# Patient Record
Sex: Male | Born: 1985 | Race: White | Hispanic: No | Marital: Single | Smoking: Never smoker
Health system: Southern US, Community
[De-identification: ages and names within clinical notes are randomized; demographics above are authoritative.]

---

## 2017-09-26 ENCOUNTER — Encounter: Payer: Self-pay | Admitting: Family Medicine

## 2017-09-26 ENCOUNTER — Ambulatory Visit (INDEPENDENT_AMBULATORY_CARE_PROVIDER_SITE_OTHER): Payer: BLUE CROSS/BLUE SHIELD | Admitting: Family Medicine

## 2017-09-26 VITALS — BP 126/80 | HR 77 | Temp 98.4°F | Ht 69.0 in | Wt 196.4 lb

## 2017-09-26 DIAGNOSIS — L989 Disorder of the skin and subcutaneous tissue, unspecified: Secondary | ICD-10-CM | POA: Diagnosis not present

## 2017-09-26 DIAGNOSIS — L409 Psoriasis, unspecified: Secondary | ICD-10-CM | POA: Insufficient documentation

## 2017-09-26 DIAGNOSIS — Z0001 Encounter for general adult medical examination with abnormal findings: Secondary | ICD-10-CM | POA: Diagnosis not present

## 2017-09-26 DIAGNOSIS — L0501 Pilonidal cyst with abscess: Secondary | ICD-10-CM | POA: Diagnosis not present

## 2017-09-26 LAB — LIPID PANEL
CHOL/HDL RATIO: 3
Cholesterol: 117 mg/dL (ref 0–200)
HDL: 37.3 mg/dL — AB (ref >39.00)
LDL CALC: 66 mg/dL (ref 0–99)
NONHDL: 80.13
Triglycerides: 69 mg/dL (ref 0.0–149.0)
VLDL: 13.8 mg/dL (ref 0.0–40.0)

## 2017-09-26 LAB — URINALYSIS, ROUTINE W REFLEX MICROSCOPIC
Bilirubin Urine: NEGATIVE
Hgb urine dipstick: NEGATIVE
Ketones, ur: NEGATIVE
Leukocytes, UA: NEGATIVE
Nitrite: NEGATIVE
PH: 6.5 (ref 5.0–8.0)
RBC / HPF: NONE SEEN (ref 0–?)
SPECIFIC GRAVITY, URINE: 1.02 (ref 1.000–1.030)
TOTAL PROTEIN, URINE-UPE24: NEGATIVE
Urine Glucose: NEGATIVE
Urobilinogen, UA: 0.2 (ref 0.0–1.0)

## 2017-09-26 LAB — COMPREHENSIVE METABOLIC PANEL
ALT: 34 U/L (ref 0–53)
AST: 19 U/L (ref 0–37)
Albumin: 4.8 g/dL (ref 3.5–5.2)
Alkaline Phosphatase: 56 U/L (ref 39–117)
BILIRUBIN TOTAL: 0.6 mg/dL (ref 0.2–1.2)
BUN: 15 mg/dL (ref 6–23)
CHLORIDE: 104 meq/L (ref 96–112)
CO2: 30 meq/L (ref 19–32)
Calcium: 9.9 mg/dL (ref 8.4–10.5)
Creatinine, Ser: 0.92 mg/dL (ref 0.40–1.50)
GFR: 100.07 mL/min (ref >60.00)
GLUCOSE: 98 mg/dL (ref 70–99)
POTASSIUM: 4.1 meq/L (ref 3.5–5.1)
Sodium: 140 mEq/L (ref 135–145)
Total Protein: 7.9 g/dL (ref 6.0–8.3)

## 2017-09-26 LAB — CBC
HCT: 45.4 % (ref 39.0–52.0)
Hemoglobin: 15.4 g/dL (ref 13.0–17.0)
MCHC: 33.9 g/dL (ref 30.0–36.0)
MCV: 87 fl (ref 78.0–100.0)
Platelets: 323 10*3/uL (ref 150.0–400.0)
RBC: 5.21 Mil/uL (ref 4.22–5.81)
RDW: 13.1 % (ref 11.5–15.5)
WBC: 6.8 10*3/uL (ref 4.0–10.5)

## 2017-09-26 MED ORDER — AMOXICILLIN-POT CLAVULANATE 875-125 MG PO TABS: 1.0000 | ORAL_TABLET | Freq: Two times a day (BID) | ORAL | 0 refills | Status: DC

## 2017-09-26 NOTE — Patient Instructions (Signed)
Pilonidal Cyst A pilonidal cyst is a fluid-filled sac. It forms beneath the skin near your tailbone, at the top of the crease of your buttocks. A pilonidal cyst that is not large or infected may not cause symptoms or problems. If the cyst becomes irritated or infected, it may fill with pus. This causes pain and swelling (pilonidal abscess). An infected cyst may need to be treated with medicine, drained, or removed. What are the causes? The cause of a pilonidal cyst is not known. One cause may be a hair that grows into your skin (ingrown hair). What increases the risk? Pilonidal cysts are more common in boys and men. Risk factors include:  Having lots of hair near the crease of the buttocks.  Being overweight.  Having a pilonidal dimple.  Wearing tight clothing.  Not bathing or showering frequently.  Sitting for long periods of time.  What are the signs or symptoms? Signs and symptoms of a pilonidal cyst may include:  Redness.  Pain and tenderness.  Warmth.  Swelling.  Pus.  Fever.  How is this diagnosed? Your health care provider may diagnose a pilonidal cyst based on your symptoms and a physical exam. The health care provider may do a blood test to check for infection. If your cyst is draining pus, your health care provider may take a sample of the drainage to be tested at a laboratory. How is this treated? Surgery is the usual treatment for an infected pilonidal cyst. You may also have to take medicines before surgery. The type of surgery you have depends on the size and severity of the infected cyst. The different kinds of surgery include:  Incision and drainage. This is a procedure to open and drain the cyst.  Marsupialization. In this procedure, a large cyst or abscess may be opened and kept open by stitching the edges of the skin to the cyst walls.  Cyst removal. This procedure involves opening the skin and removing all or part of the cyst.  Follow these  instructions at home:  Follow all of your surgeon's instructions carefully if you had surgery.  Take medicines only as directed by your health care provider.  If you were prescribed an antibiotic medicine, finish it all even if you start to feel better.  Keep the area around your pilonidal cyst clean and dry.  Clean the area as directed by your health care provider. Pat the area dry with a clean towel. Do not rub it as this may cause bleeding.  Remove hair from the area around the cyst as directed by your health care provider.  Do not wear tight clothing or sit in one place for long periods of time.  There are many different ways to close and cover an incision, including stitches, skin glue, and adhesive strips. Follow your health care provider's instructions on: ? Incision care. ? Bandage (dressing) changes and removal. ? Incision closure removal. Contact a health care provider if:  You have drainage, redness, swelling, or pain at the site of the cyst.  You have a fever. This information is not intended to replace advice given to you by your health care provider. Make sure you discuss any questions you have with your health care provider. Document Released: 03/24/2000 Document Revised: 09/02/2015 Document Reviewed: 08/14/2013 Elsevier Interactive Patient Education  2018 [employer]  

## 2017-09-26 NOTE — Progress Notes (Signed)
Subjective:  Patient ID: Adam Hodge, male    DOB: [birth year]  Age: 32 y.o. MRN: [id number]  CC: Establish Care (possible skin cancer? Located on upper left arm.)   HPI Adam Hodge presents for evaluation of the skin lesion on his left upper arm that is been present for a couple of months now.  It has been growing slowly.  It started out as button sized to its current size.  A week or so ago he got up in the middle of the night to go to the bathroom and tripped over his cat.  He hit the doorway and the lesion was traumatically excised.  Patient describes the lesion is being raised uniform in color and purplish.  He does not believe it was centrally umbilicated.  He has been left with a somewhat painful circular lesion.  Significant past medical history of excision of multiple BCCs.  He is status post Mohs surgery on his right cheek approximately 6 years ago.  He has a past medical history of psoriasis.  He has a tender place in his superior gluteal cleft that he has noticed over the last few.  His mother is age 59 and has recently been diagnosed with diabetes.  His father's health history is unknown.  History Adam Hodge has no past medical history on file.   He has no past surgical history on file.   His family history is not on file.He reports that he has never smoked. He has never used smokeless tobacco. His alcohol and drug histories are not on file.  No outpatient medications prior to visit.   No facility-administered medications prior to visit.     ROS Review of Systems  Constitutional: Negative.   HENT: Negative.   Eyes: Negative.   Respiratory: Negative.   Cardiovascular: Negative.   Gastrointestinal: Negative.   Endocrine: Negative for polyphagia and polyuria.  Genitourinary: Negative.   Musculoskeletal: Negative for arthralgias and myalgias.  Skin: Positive for color change and wound.  Allergic/Immunologic: Negative for immunocompromised state.  Neurological: Negative.     Hematological: Negative.   Psychiatric/Behavioral: Negative.     Objective:  BP 126/80   Pulse 77   Temp 98.4 F (36.9 C)   Ht 5\' 9"  (1.753 m)   Wt 196 lb 6 oz (89.1 kg)   SpO2 97%   BMI 29.00 kg/m   Physical Exam  Constitutional: He is oriented to person, place, and time. He appears well-developed and well-nourished. No distress.  HENT:  Head: Normocephalic and atraumatic.  Right Ear: External ear normal.  Left Ear: External ear normal.  Eyes: Right eye exhibits no discharge. Left eye exhibits no discharge.  Neck: No JVD present. No tracheal deviation present.  Pulmonary/Chest: Effort normal.  Neurological: He is alert and oriented to person, place, and time.  Skin: Skin is warm and dry. He is not diaphoretic.     Psychiatric: He has a normal mood and affect. His behavior is normal. Thought content normal.      Assessment & Plan:   Adam Hodge was seen today for establish care.  Diagnoses and all orders for this visit:  Encounter for health maintenance examination with abnormal findings -     CBC -     Comprehensive metabolic panel -     Lipid panel -     Urinalysis, Routine w reflex microscopic  Pilonidal cyst with abscess -     amoxicillin-clavulanate (AUGMENTIN) 875-125 MG tablet; Take 1 tablet by mouth 2 (two) times daily.  Skin lesion of left upper extremity -     Ambulatory referral to Dermatology  Psoriasis   I am having Adam Hodge start on amoxicillin-clavulanate.  Meds ordered this encounter  Medications  . amoxicillin-clavulanate (AUGMENTIN) 875-125 MG tablet    Sig: Take 1 tablet by mouth 2 (two) times daily.    Dispense:  20 tablet    Refill:  0     Follow-up: Return in about 1 month (around 10/26/2017), or return for a complete physical exam..  Mliss SaxWilliam Alfred Kremer, MD

## 2017-10-08 ENCOUNTER — Ambulatory Visit (INDEPENDENT_AMBULATORY_CARE_PROVIDER_SITE_OTHER): Payer: BLUE CROSS/BLUE SHIELD | Admitting: Family Medicine

## 2017-10-08 ENCOUNTER — Encounter: Payer: Self-pay | Admitting: Family Medicine

## 2017-10-08 VITALS — BP 134/80 | HR 91 | Temp 98.3°F | Ht 69.0 in | Wt 199.2 lb

## 2017-10-08 DIAGNOSIS — L989 Disorder of the skin and subcutaneous tissue, unspecified: Secondary | ICD-10-CM | POA: Diagnosis not present

## 2017-10-08 DIAGNOSIS — M79602 Pain in left arm: Secondary | ICD-10-CM

## 2017-10-08 DIAGNOSIS — S46209A Unspecified injury of muscle, fascia and tendon of other parts of biceps, unspecified arm, initial encounter: Secondary | ICD-10-CM | POA: Diagnosis not present

## 2017-10-08 DIAGNOSIS — L0501 Pilonidal cyst with abscess: Secondary | ICD-10-CM

## 2017-10-08 DIAGNOSIS — M25532 Pain in left wrist: Secondary | ICD-10-CM | POA: Insufficient documentation

## 2017-10-08 LAB — D-DIMER, QUANTITATIVE (NOT AT ARMC): D DIMER QUANT: 0.28 ug{FEU}/mL (ref <0.50)

## 2017-10-08 NOTE — Progress Notes (Signed)
Subjective:  Patient ID: Adam Hodge, male    DOB: April 14, 1985  Age: 32 y.o. MRN: [id number]  CC: Follow-up (left arm, completed antibiotic, feels like arm is improving.)   HPI Adam Hodge presents for a lesion on his left upper arm and his pilonidal cyst.  Lesion on the upper arm has scabbed over and is doing better.  Pilonidal cyst seems to be healing.  There is less drainage.  It is less tender.  He tells that he experienced left arm pain in the medial upper arm after carrying a heavy bookcase of the steps into his apartment.  It seems to be improving but initially there was pain with range of motion to the arm.  There is no family history or personal history of DVT.  He is right-hand dominant.  He had suffered a FOOSH injury a year and a half ago involving this arm that did not require medical care.  Outpatient Medications Prior to Visit  Medication Sig Dispense Refill  . amoxicillin-clavulanate (AUGMENTIN) 875-125 MG tablet Take 1 tablet by mouth 2 (two) times daily. 20 tablet 0   No facility-administered medications prior to visit.     ROS Review of Systems  Constitutional: Negative.   HENT: Negative.   Respiratory: Negative for chest tightness, shortness of breath and wheezing.   Cardiovascular: Negative for chest pain.  Musculoskeletal: Positive for myalgias.  Skin: Positive for wound.    Objective:  BP 134/80   Pulse 91   Temp 98.3 F (36.8 C)   Ht 5\' 9"  (1.753 m)   Wt 199 lb 4 oz (90.4 kg)   SpO2 98%   BMI 29.42 kg/m   BP Readings from Last 3 Encounters:  10/08/17 134/80  09/26/17 126/80    Wt Readings from Last 3 Encounters:  10/08/17 199 lb 4 oz (90.4 kg)  09/26/17 196 lb 6 oz (89.1 kg)    Physical Exam  Constitutional: He is oriented to person, place, and time. He appears well-developed and well-nourished. No distress.  Cardiovascular: Intact distal pulses.  Musculoskeletal: He exhibits tenderness and deformity. He exhibits no edema.        Arms: Neurological: He is alert and oriented to person, place, and time.  Skin: He is not diaphoretic.    Lab Results  Component Value Date   WBC 6.8 09/26/2017   HGB 15.4 09/26/2017   HCT 45.4 09/26/2017   PLT 323.0 09/26/2017   GLUCOSE 98 09/26/2017   CHOL 117 09/26/2017   TRIG 69.0 09/26/2017   HDL 37.30 (L) 09/26/2017   LDLCALC 66 09/26/2017   ALT 34 09/26/2017   AST 19 09/26/2017   NA 140 09/26/2017   K 4.1 09/26/2017   CL 104 09/26/2017   CREATININE 0.92 09/26/2017   BUN 15 09/26/2017   CO2 30 09/26/2017    Patient was never admitted.  Assessment & Plan:   Adam Hodge was seen today for follow-up.  Diagnoses and all orders for this visit:  Pilonidal cyst with abscess  Skin lesion of left upper extremity  Arm pain, anterior, left -     D-Dimer, Quantitative -     Ambulatory referral to Sports Medicine  Injury of biceps brachii muscle -     Ambulatory referral to Sports Medicine   I have discontinued Adam Hodge's amoxicillin-clavulanate.  No orders of the defined types were placed in this encounter.  Lesion on left upper arm is scabbed over and healing.  Still believe that it warrants dermatology referral for consideration  of biopsy.  Pilonidal cyst is healing but remains intact.  He will continue sits baths.  May consider surgical referral as needed.  Question biceps/brachialis muscle injury.  Sports medicine referral for consideration of ultrasound.  Remotely concerned about DVT and we are drawing a d-dimer today.  Patient low risk for DVT, especially in his arm.  Follow-up: Return in about 2 weeks (around 10/22/2017), or if symptoms worsen or fail to improve.  Mliss SaxWilliam Alfred Reygan Heagle, MD

## 2017-10-16 ENCOUNTER — Encounter: Payer: Self-pay | Admitting: Family Medicine

## 2017-10-16 ENCOUNTER — Ambulatory Visit (INDEPENDENT_AMBULATORY_CARE_PROVIDER_SITE_OTHER): Payer: BLUE CROSS/BLUE SHIELD

## 2017-10-16 ENCOUNTER — Ambulatory Visit (INDEPENDENT_AMBULATORY_CARE_PROVIDER_SITE_OTHER): Payer: BLUE CROSS/BLUE SHIELD | Admitting: Family Medicine

## 2017-10-16 VITALS — BP 136/78 | HR 86 | Ht 69.0 in | Wt 199.0 lb

## 2017-10-16 DIAGNOSIS — M25532 Pain in left wrist: Secondary | ICD-10-CM | POA: Diagnosis not present

## 2017-10-16 DIAGNOSIS — M79602 Pain in left arm: Secondary | ICD-10-CM

## 2017-10-16 LAB — URIC ACID: Uric Acid, Serum: 5.6 mg/dL (ref 4.0–7.8)

## 2017-10-16 LAB — C-REACTIVE PROTEIN: CRP: 0.4 mg/dL — ABNORMAL LOW (ref 0.5–20.0)

## 2017-10-16 LAB — SEDIMENTATION RATE: Sed Rate: 21 mm/hr — ABNORMAL HIGH (ref 0–15)

## 2017-10-16 MED ORDER — PREDNISONE 5 MG PO TABS: ORAL_TABLET | ORAL | 0 refills | Status: AC

## 2017-10-16 NOTE — Assessment & Plan Note (Signed)
Pain has been ongoing since his fall back onto his hand from a year and a half ago.  Having range of motion issues and appears to have effusion on ultrasound.  Possible for complex regional pain syndrome versus nonhealing fracture.  Unlikely to be autoimmune due to mechanism of trauma. -X-ray today -CRP, sed rate, ANA, RF, anti-CCP -Prednisone -will call with results.

## 2017-10-16 NOTE — Patient Instructions (Signed)
Good to see you  Please try the medication  I will call with the results from today

## 2017-10-16 NOTE — Progress Notes (Signed)
Adam Hodge - 32 y.o. male MRN [id number]  Date of birth: January 13, 1986  SUBJECTIVE:  Including CC & ROS.  Chief Complaint  Patient presents with  . Left arm pain    Adam Hodge is a 32 y.o. male that is here today for left arm pain and wrist pain. He fell one year ago in his house he reached back with his left arm to brace his fall. He has been experiencing pain in the dorsal and volar aspect of his wrist as well as pain that radiates over the anterior aspect of his arm proximally to his axilla.. Admits to swelling. Pain is triggered with extension. He has wore wrist brace in the past. He works in [employer]. He has been taking Aleve for the pain.  He has a history of psoriasis.  He has weakness if uses his left hand repetitively or using it for an extended period of time longer than an hour.  He denies any numbness or tingling.  Review of his blood work from 7/1 shows a normal d-dimer.   Review of Systems  Constitutional: Negative for fever.  HENT: Negative for congestion.   Respiratory: Negative for cough.   Cardiovascular: Negative for chest pain.  Gastrointestinal: Negative for abdominal pain.  Musculoskeletal: Positive for joint swelling.  Skin: Negative for color change.  Neurological: Positive for weakness.  Hematological: Negative for adenopathy.  Psychiatric/Behavioral: Negative for agitation.    HISTORY: Past Medical, Surgical, Social, and Family History Reviewed & Updated per EMR.   Pertinent Historical Findings include:  No past medical history on file.  No past surgical history on file.  No Known Allergies  Family History  Problem Relation Age of Onset  . Diabetes Mother   . Hyperlipidemia Paternal Grandmother   . Hypertension Paternal Grandmother   . Heart attack Paternal Grandfather      Social History   Socioeconomic History  . Marital status: Single    Spouse name: Not on file  . Number of children: Not on file  . Years of education: Not on file    . Highest education level: Not on file  Occupational History  . Not on file  Social Needs  . Financial resource strain: Not on file  . Food insecurity:    Worry: Not on file    Inability: Not on file  . Transportation needs:    Medical: Not on file    Non-medical: Not on file  Tobacco Use  . Smoking status: Never Smoker  . Smokeless tobacco: Never Used  Substance and Sexual Activity  . Alcohol use: Yes  . Drug use: Never  . Sexual activity: Yes    Partners: Female  Lifestyle  . Physical activity:    Days per week: Not on file    Minutes per session: Not on file  . Stress: Not on file  Relationships  . Social connections:    Talks on phone: Not on file    Gets together: Not on file    Attends religious service: Not on file    Active member of club or organization: Not on file    Attends meetings of clubs or organizations: Not on file    Relationship status: Not on file  . Intimate partner violence:    Fear of current or ex partner: Not on file    Emotionally abused: Not on file    Physically abused: Not on file    Forced sexual activity: Not on file  Other Topics Concern  .  Not on file  Social History Narrative  . Not on file     PHYSICAL EXAM:  VS: BP 136/78 (BP Location: Right Arm, Patient Position: Sitting, Cuff Size: Normal)   Pulse 86   Ht 5\' 9"  (1.753 m)   Wt 199 lb (90.3 kg)   BMI 29.39 kg/m  Physical Exam Gen: NAD, alert, cooperative with exam, well-appearing ENT: normal lips, normal nasal mucosa,  Eye: normal EOM, normal conjunctiva and lids CV:  no edema, +2 pedal pulses   Resp: no accessory muscle use, non-labored,  Skin: no rashes, no areas of induration  Neuro: normal tone, normal sensation to touch Psych:  normal insight, alert and oriented MSK:  Left wrist:  The wrist appears to be swollen with no ecchymosis. Limited flexion and extension actively and passively.  Pain with passive movement flexion and extension when trying to take to full  range of motion Normal finger abduction and abduction No tenderness over the snuffbox. Tenderness to palpation over the distal ulna. Normal thumb opposition. Normal pincer grasp. No signs of atrophy. Neurovascularly intact  Limited ultrasound: Left wrist:  Normal-appearing dorsal compartment tendons. Normal-appearing distal radius and ulna. There appears to be an effusion within the carpal bones of the scaphoid junction. Scaphoid has a some heterogeneity on ultrasound to suggest inflammatory change   Summary: Effusion in the wrist to suggest either an autoimmune, nonhealing scaphoid fracture or complex regional pain syndrome.  Ultrasound and interpretation by Clare GandyJeremy Jasir Rother, MD      ASSESSMENT & PLAN:   Left wrist pain Pain has been ongoing since his fall back onto his hand from a year and a half ago.  Having range of motion issues and appears to have effusion on ultrasound.  Possible for complex regional pain syndrome versus nonhealing fracture.  Unlikely to be autoimmune due to mechanism of trauma. -X-ray today -CRP, sed rate, ANA, RF, anti-CCP -Prednisone -will call with results.

## 2017-10-17 ENCOUNTER — Telehealth: Payer: Self-pay | Admitting: Family Medicine

## 2017-10-17 NOTE — Telephone Encounter (Signed)
Spoke with patient about his results. Has improvement with prednisone. Will wait if wants to pursue an MRI or not.   Myra RudeSchmitz, Afshin Chrystal E, MD [facility] 10/17/2017, 2:22 PM

## 2017-10-18 LAB — RHEUMATOID FACTOR: Rhuematoid fact SerPl-aCnc: <14 IU/mL (ref <14)

## 2017-10-18 LAB — CYCLIC CITRUL PEPTIDE ANTIBODY, IGG: Cyclic Citrullin Peptide Ab: <16 UNITS

## 2017-10-18 LAB — ANA: Anti Nuclear Antibody(ANA): NEGATIVE

## 2017-11-26 DIAGNOSIS — D485 Neoplasm of uncertain behavior of skin: Secondary | ICD-10-CM | POA: Diagnosis not present

## 2017-11-26 DIAGNOSIS — D2262 Melanocytic nevi of left upper limb, including shoulder: Secondary | ICD-10-CM | POA: Diagnosis not present

## 2017-11-26 DIAGNOSIS — S61239A Puncture wound without foreign body of unspecified finger without damage to nail, initial encounter: Secondary | ICD-10-CM | POA: Diagnosis not present

## 2017-11-26 DIAGNOSIS — D487 Neoplasm of uncertain behavior of other specified sites: Secondary | ICD-10-CM | POA: Diagnosis not present

## 2017-11-26 DIAGNOSIS — L0889 Other specified local infections of the skin and subcutaneous tissue: Secondary | ICD-10-CM | POA: Diagnosis not present

## 2017-11-26 DIAGNOSIS — A499 Bacterial infection, unspecified: Secondary | ICD-10-CM | POA: Diagnosis not present

## 2017-11-26 DIAGNOSIS — L409 Psoriasis, unspecified: Secondary | ICD-10-CM | POA: Diagnosis not present

## 2017-11-26 DIAGNOSIS — Z7721 Contact with and (suspected) exposure to potentially hazardous body fluids: Secondary | ICD-10-CM | POA: Diagnosis not present

## 2017-12-13 DIAGNOSIS — D485 Neoplasm of uncertain behavior of skin: Secondary | ICD-10-CM | POA: Diagnosis not present

## 2017-12-13 DIAGNOSIS — L905 Scar conditions and fibrosis of skin: Secondary | ICD-10-CM | POA: Diagnosis not present

## 2018-07-10 DIAGNOSIS — A09 Infectious gastroenteritis and colitis, unspecified: Secondary | ICD-10-CM | POA: Diagnosis not present

## 2018-08-22 DIAGNOSIS — L089 Local infection of the skin and subcutaneous tissue, unspecified: Secondary | ICD-10-CM | POA: Diagnosis not present

## 2018-08-22 DIAGNOSIS — D485 Neoplasm of uncertain behavior of skin: Secondary | ICD-10-CM | POA: Diagnosis not present

## 2018-08-22 DIAGNOSIS — R609 Edema, unspecified: Secondary | ICD-10-CM | POA: Diagnosis not present

## 2018-08-22 DIAGNOSIS — L739 Follicular disorder, unspecified: Secondary | ICD-10-CM | POA: Diagnosis not present

## 2018-08-22 DIAGNOSIS — L409 Psoriasis, unspecified: Secondary | ICD-10-CM | POA: Diagnosis not present

## 2018-08-22 DIAGNOSIS — A499 Bacterial infection, unspecified: Secondary | ICD-10-CM | POA: Diagnosis not present

## 2018-08-22 DIAGNOSIS — L720 Epidermal cyst: Secondary | ICD-10-CM | POA: Diagnosis not present

## 2018-08-22 DIAGNOSIS — L308 Other specified dermatitis: Secondary | ICD-10-CM | POA: Diagnosis not present

## 2018-09-05 DIAGNOSIS — L905 Scar conditions and fibrosis of skin: Secondary | ICD-10-CM | POA: Diagnosis not present

## 2019-01-17 DIAGNOSIS — Z85828 Personal history of other malignant neoplasm of skin: Secondary | ICD-10-CM | POA: Diagnosis not present

## 2019-01-17 DIAGNOSIS — L570 Actinic keratosis: Secondary | ICD-10-CM | POA: Diagnosis not present

## 2019-01-17 DIAGNOSIS — L739 Follicular disorder, unspecified: Secondary | ICD-10-CM | POA: Diagnosis not present

## 2019-01-17 DIAGNOSIS — L814 Other melanin hyperpigmentation: Secondary | ICD-10-CM | POA: Diagnosis not present

## 2019-11-11 IMAGING — DX DG WRIST COMPLETE 3+V*L*
4 series · 4 of 4 positions shown · non-contrast
Comparison: None.

CLINICAL DATA: Fell 1 year ago, with pain in the wrist since that
time

EXAM:
LEFT WRIST - COMPLETE 3+ VIEW

[wrist pa]
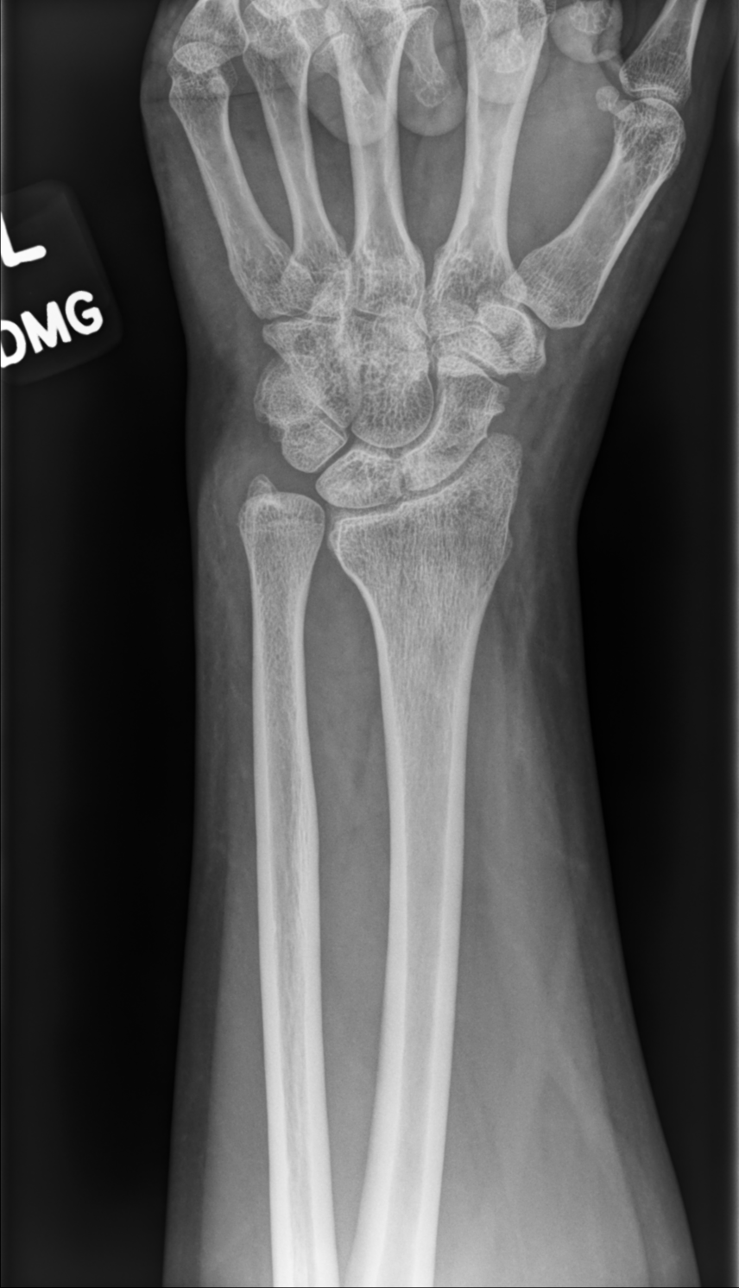

[wrist mlo]
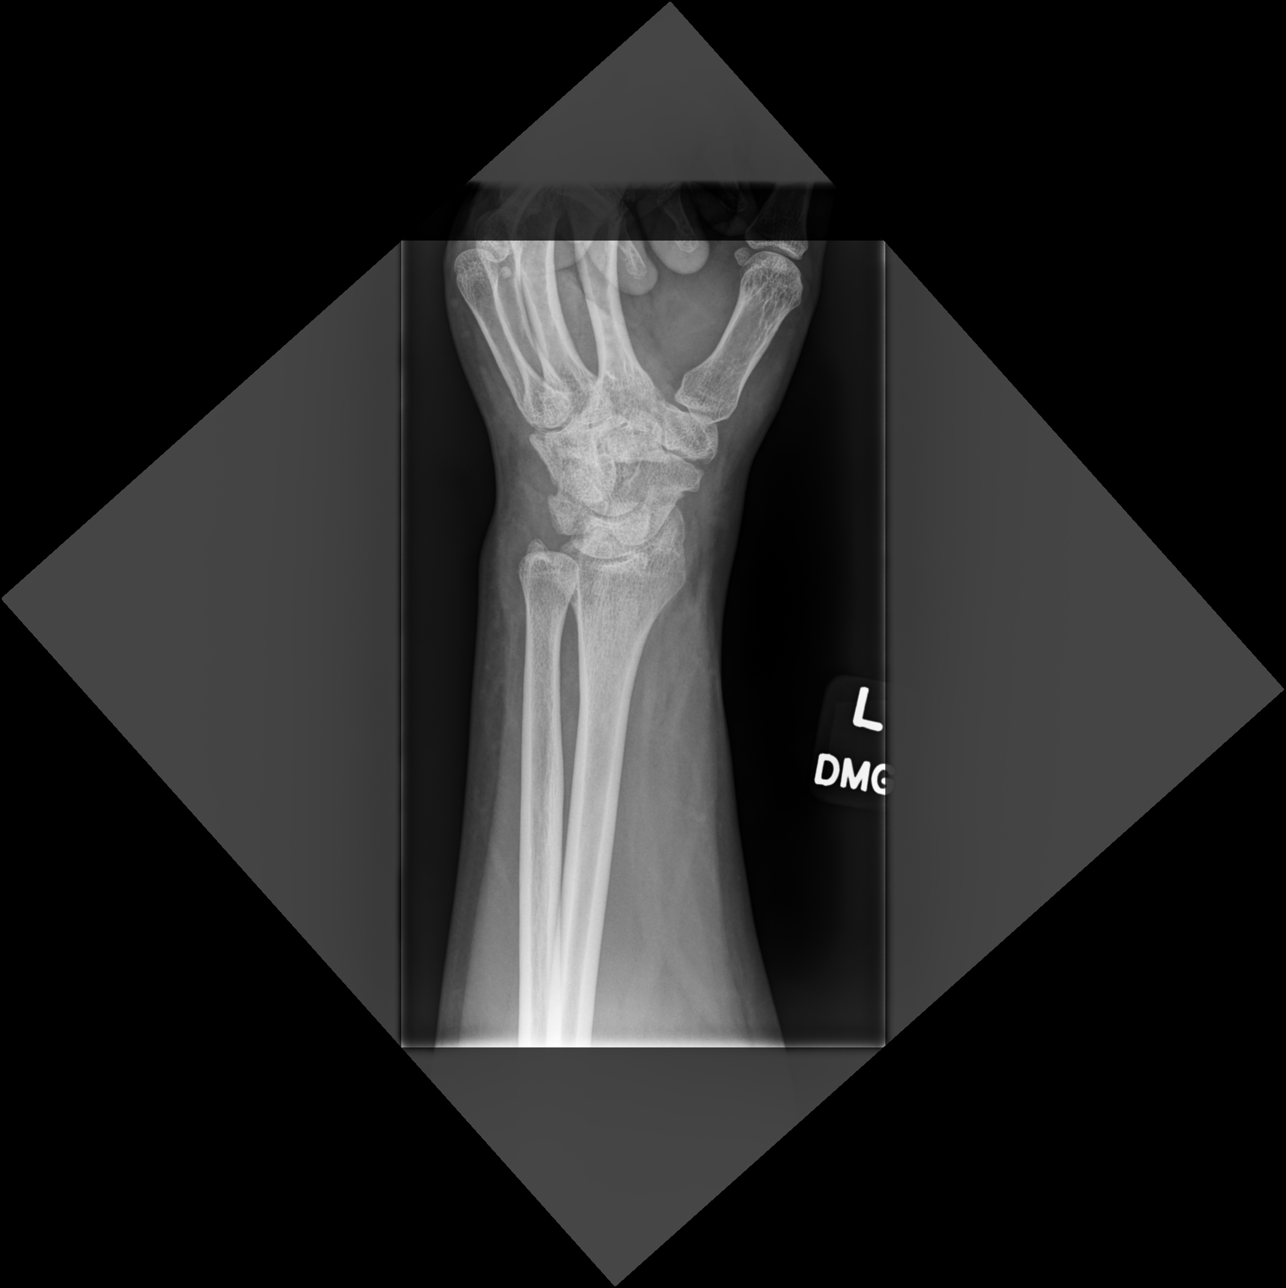

[wrist lat]
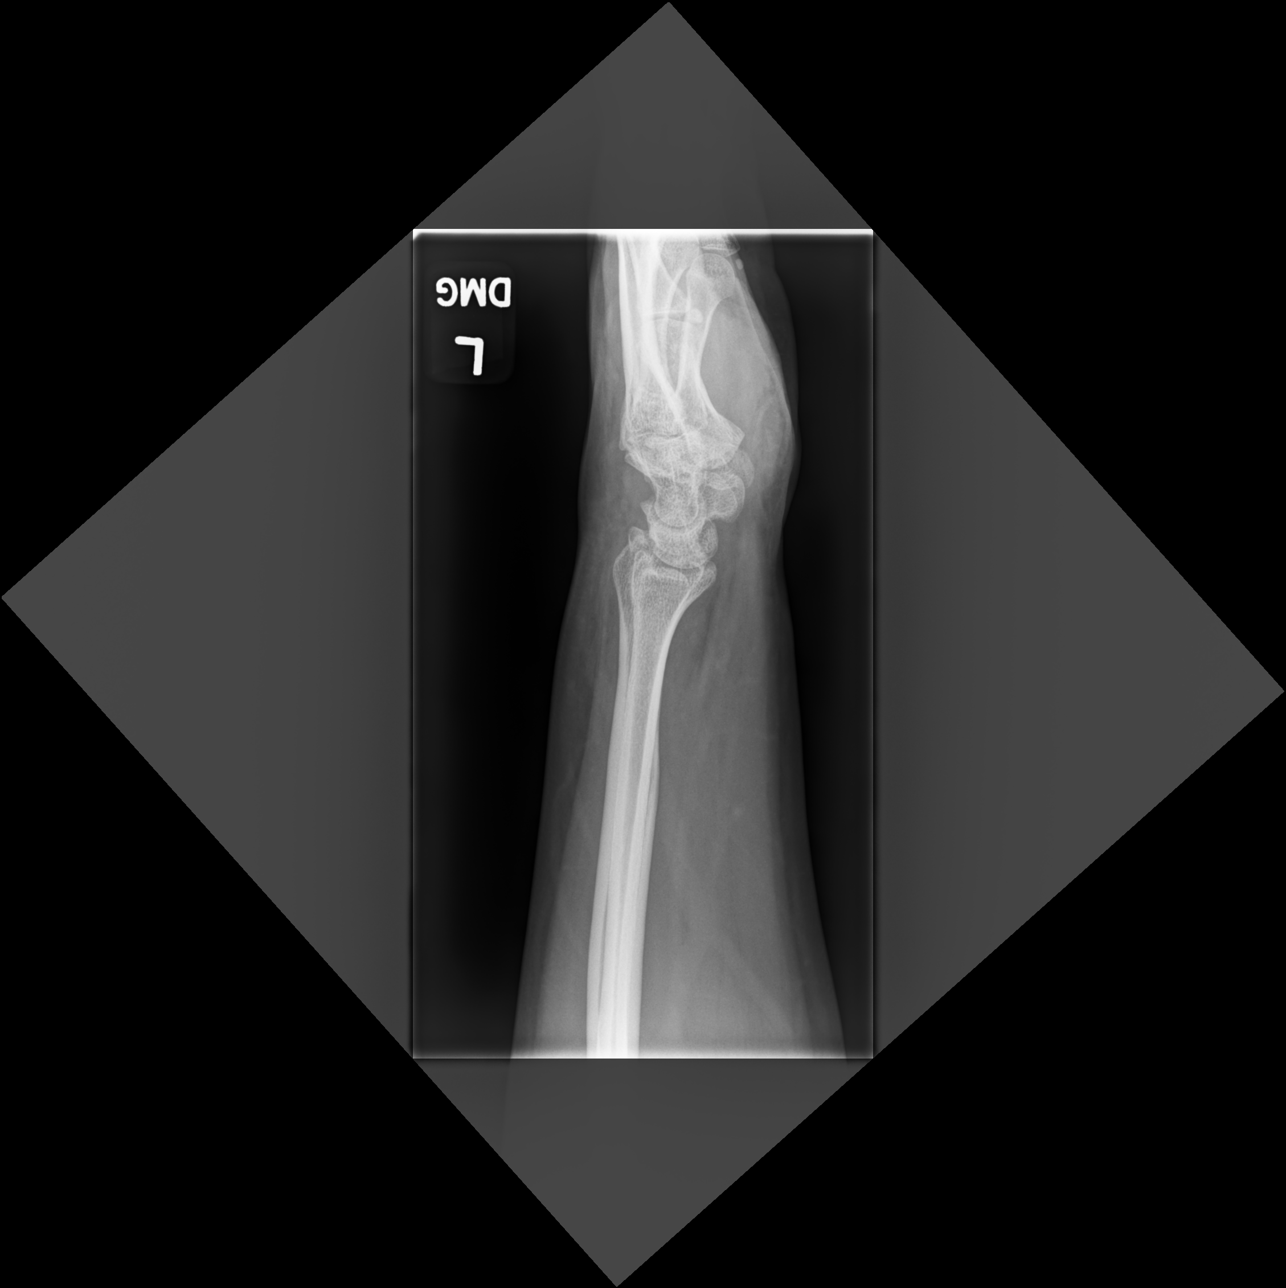

[wrist (navicular) pa]
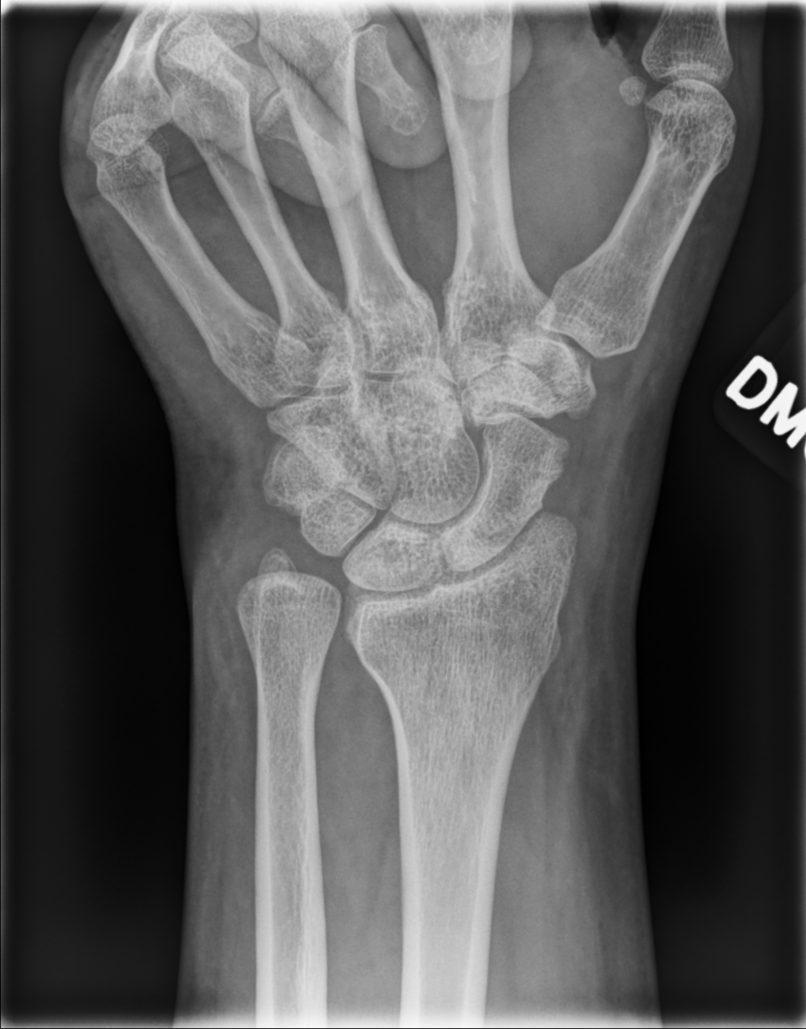

[4 of 4 positions shown; findings below may reference images not displayed]

FINDINGS: There is slight irregularity of the radial styloid with some
trabecular irregularity as well most consistent with a healed prior
fracture. No acute fracture is seen. The carpal bones are in normal
position with normal alignment.
IMPRESSION: Suspect old healed fracture of the left radial styloid. No acute
abnormality.
